# Patient Record
Sex: Male | Born: 1950 | Race: Black or African American | Hispanic: No
Health system: Southern US, Community
[De-identification: ages and names within clinical notes are randomized; demographics above are authoritative.]

---

## 2004-03-23 ENCOUNTER — Emergency Department: Payer: Self-pay | Admitting: Emergency Medicine

## 2004-04-01 ENCOUNTER — Other Ambulatory Visit: Payer: Self-pay

## 2005-08-12 ENCOUNTER — Other Ambulatory Visit: Payer: Self-pay

## 2005-08-12 ENCOUNTER — Emergency Department: Payer: Self-pay | Admitting: Emergency Medicine

## 2005-08-17 ENCOUNTER — Other Ambulatory Visit: Payer: Self-pay

## 2005-08-17 ENCOUNTER — Emergency Department: Payer: Self-pay | Admitting: Emergency Medicine

## 2005-08-23 ENCOUNTER — Emergency Department: Payer: Self-pay | Admitting: Emergency Medicine

## 2005-10-15 ENCOUNTER — Ambulatory Visit: Payer: Self-pay | Admitting: Internal Medicine

## 2006-11-22 ENCOUNTER — Inpatient Hospital Stay: Payer: Self-pay | Admitting: *Deleted

## 2007-06-19 ENCOUNTER — Ambulatory Visit: Payer: Self-pay

## 2012-02-28 ENCOUNTER — Inpatient Hospital Stay: Payer: Self-pay | Admitting: Internal Medicine

## 2012-02-28 LAB — CBC
HGB: 15 g/dL (ref 13.0–18.0)
MCH: 30 pg (ref 26.0–34.0)
MCV: 89 fL (ref 80–100)
Platelet: 233 10*3/uL (ref 150–440)
RBC: 5 10*6/uL (ref 4.40–5.90)
RDW: 12.3 % (ref 11.5–14.5)
WBC: 7.7 10*3/uL (ref 3.8–10.6)

## 2012-02-28 LAB — COMPREHENSIVE METABOLIC PANEL
Anion Gap: 8 (ref 7–16)
Bilirubin,Total: 0.7 mg/dL (ref 0.2–1.0)
Chloride: 102 mmol/L (ref 98–107)
Co2: 24 mmol/L (ref 21–32)
Creatinine: 1.02 mg/dL (ref 0.60–1.30)
EGFR (African American): >60
EGFR (Non-African Amer.): >60
Osmolality: 276 (ref 275–301)
Potassium: 4 mmol/L (ref 3.5–5.1)
Sodium: 134 mmol/L — ABNORMAL LOW (ref 136–145)
Total Protein: 8.7 g/dL — ABNORMAL HIGH (ref 6.4–8.2)

## 2012-02-28 LAB — URINALYSIS, COMPLETE
Bilirubin,UR: NEGATIVE
Glucose,UR: >=500 mg/dL (ref 0–75)
Leukocyte Esterase: NEGATIVE
Ph: 5 (ref 4.5–8.0)
Protein: 30
RBC,UR: 2 /HPF (ref 0–5)
Squamous Epithelial: NONE SEEN
WBC UR: 1 /HPF (ref 0–5)

## 2012-02-28 LAB — DRUG SCREEN, URINE
Barbiturates, Ur Screen: NEGATIVE (ref ?–200)
Cannabinoid 50 Ng, Ur ~~LOC~~: NEGATIVE (ref ?–50)
Cocaine Metabolite,Ur ~~LOC~~: NEGATIVE (ref ?–300)
MDMA (Ecstasy)Ur Screen: NEGATIVE (ref ?–500)
Methadone, Ur Screen: NEGATIVE (ref ?–300)
Opiate, Ur Screen: NEGATIVE (ref ?–300)
Phencyclidine (PCP) Ur S: NEGATIVE (ref ?–25)

## 2012-02-29 LAB — BASIC METABOLIC PANEL
Anion Gap: 6 — ABNORMAL LOW (ref 7–16)
Calcium, Total: 8.6 mg/dL (ref 8.5–10.1)
Chloride: 106 mmol/L (ref 98–107)
Creatinine: 0.99 mg/dL (ref 0.60–1.30)
EGFR (Non-African Amer.): >60
Glucose: 206 mg/dL — ABNORMAL HIGH (ref 65–99)
Osmolality: 281 (ref 275–301)
Sodium: 137 mmol/L (ref 136–145)

## 2012-02-29 LAB — LIPID PANEL
Cholesterol: 148 mg/dL (ref 0–200)
HDL Cholesterol: 50 mg/dL (ref 40–60)
Ldl Cholesterol, Calc: 86 mg/dL (ref 0–100)
VLDL Cholesterol, Calc: 12 mg/dL (ref 5–40)

## 2012-03-04 ENCOUNTER — Ambulatory Visit: Payer: Self-pay | Admitting: Neurology

## 2012-03-06 DIAGNOSIS — G459 Transient cerebral ischemic attack, unspecified: Secondary | ICD-10-CM

## 2012-04-01 ENCOUNTER — Inpatient Hospital Stay: Payer: Self-pay | Admitting: Student

## 2012-04-01 LAB — CBC
HCT: 40.3 % (ref 40.0–52.0)
HGB: 14 g/dL (ref 13.0–18.0)
MCH: 29.6 pg (ref 26.0–34.0)
MCV: 86 fL (ref 80–100)
Platelet: 243 10*3/uL (ref 150–440)
RBC: 4.72 10*6/uL (ref 4.40–5.90)
WBC: 5.3 10*3/uL (ref 3.8–10.6)

## 2012-04-01 LAB — COMPREHENSIVE METABOLIC PANEL
Albumin: 4.3 g/dL (ref 3.4–5.0)
Alkaline Phosphatase: 76 U/L (ref 50–136)
Anion Gap: 7 (ref 7–16)
BUN: 16 mg/dL (ref 7–18)
Calcium, Total: 9.6 mg/dL (ref 8.5–10.1)
Chloride: 110 mmol/L — ABNORMAL HIGH (ref 98–107)
EGFR (Non-African Amer.): >60
Glucose: 137 mg/dL — ABNORMAL HIGH (ref 65–99)
Potassium: 4.2 mmol/L (ref 3.5–5.1)
SGOT(AST): 70 U/L — ABNORMAL HIGH (ref 15–37)
SGPT (ALT): 141 U/L — ABNORMAL HIGH (ref 12–78)
Total Protein: 8.8 g/dL — ABNORMAL HIGH (ref 6.4–8.2)

## 2012-04-02 ENCOUNTER — Ambulatory Visit: Payer: Self-pay | Admitting: Neurology

## 2012-04-02 LAB — CBC WITH DIFFERENTIAL/PLATELET
Basophil #: 0 10*3/uL (ref 0.0–0.1)
Eosinophil #: 0.1 10*3/uL (ref 0.0–0.7)
Lymphocyte %: 36.7 %
MCHC: 33.9 g/dL (ref 32.0–36.0)
MCV: 86 fL (ref 80–100)
Monocyte %: 7.7 %
Neutrophil %: 53.8 %
Platelet: 215 10*3/uL (ref 150–440)
RDW: 11.9 % (ref 11.5–14.5)
WBC: 5.5 10*3/uL (ref 3.8–10.6)

## 2012-04-02 LAB — HEMOGLOBIN A1C: Hemoglobin A1C: 7.6 % — ABNORMAL HIGH (ref 4.2–6.3)

## 2012-04-02 LAB — BASIC METABOLIC PANEL
Anion Gap: 8 (ref 7–16)
Calcium, Total: 8.5 mg/dL (ref 8.5–10.1)
Chloride: 107 mmol/L (ref 98–107)
Co2: 21 mmol/L (ref 21–32)
Creatinine: 1.01 mg/dL (ref 0.60–1.30)
Osmolality: 278 (ref 275–301)

## 2012-04-02 LAB — OCCULT BLOOD X 1 CARD TO LAB, STOOL: Occult Blood, Feces: NEGATIVE

## 2012-04-03 LAB — CBC WITH DIFFERENTIAL/PLATELET
Basophil #: 0 10*3/uL (ref 0.0–0.1)
Basophil %: 0.3 %
Eosinophil #: 0.1 10*3/uL (ref 0.0–0.7)
HCT: 32.1 % — ABNORMAL LOW (ref 40.0–52.0)
Lymphocyte #: 2.2 10*3/uL (ref 1.0–3.6)
Lymphocyte %: 49.5 %
MCH: 29.1 pg (ref 26.0–34.0)
MCV: 84 fL (ref 80–100)
Monocyte #: 0.5 x10 3/mm (ref 0.2–1.0)
Monocyte %: 10.1 %
Neutrophil #: 1.7 10*3/uL (ref 1.4–6.5)
RBC: 3.84 10*6/uL — ABNORMAL LOW (ref 4.40–5.90)
RDW: 12.6 % (ref 11.5–14.5)
WBC: 4.5 10*3/uL (ref 3.8–10.6)
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
## 2014-07-30 NOTE — H&P (Signed)
PATIENT NAME:  Jordan [name], Jordan [name] MR#:  [id number] DATE OF BIRTH:  27-Mar-1951  DATE OF ADMISSION:  02/28/2012  ER REFERRING PHYSICIAN: Dr. Mayford KnifeWilliams.   PRIMARY CARE PHYSICIAN: [facility].   CHIEF COMPLAINT: Dizziness, headache, nausea and vomiting.   HISTORY OF PRESENT ILLNESS: The patient is a 64 year old male with past medical history of diabetes, hypertension, lower GI bleed due to diverticulosis, who reports that for the past one week he has been having sudden onset of dizziness, dizzy spells which last for a few minutes, associated with loss of balance. When he has these spells, the left side of his head and face feels funny, and his left ear feels funny. He denies any ear pain or tinnitus. The dizzy spells happen even if he is lying down or sitting up. The patient fells like he is drunk. For the last 2 to 3 days every time he has been trying to take his medications, especially metformin, he has been feeling nauseous and has thrown up. He denies any recent upper respiratory infection, denies having any similar symptoms in the past. The patient reports that about two weeks ago he came in contact with an organization that was doing free blood work for testing for sexually transmitted diseases, HIV and hepatitis; and he gave blood for that when he was found to have hepatitis C. The patient reports that he has received blood transfusion in the past when he had a lower GI bleed.   ALLERGIES: Sulfa medications.   MEDICATIONS:  1. Glipizide 2.5 mg daily.  2. Lisinopril 20 mg b.i.[name].  3. Metformin 500 mg b.i.[name].   PAST MEDICAL HISTORY:  1. Diabetes. 2. Hypertension. 3. Recently diagnosed hepatitis C.  4. History of diverticulosis with lower gastrointestinal bleed.  5. History of diverticulitis.   PAST SURGICAL HISTORY: Cholecystectomy.   SOCIAL HISTORY: Denies any history of smoking, alcohol or drug abuse. He is currently homeless and is living in a church shelter.   FAMILY HISTORY: Diabetes  and hypertension runs in his family. Grandfather died of myocardial infarction. Mother and two sisters had breast cancer.    REVIEW OF SYSTEMS: CONSTITUTIONAL: Denies any fever, fatigue, weakness. EYES: Denies any blurred or double vision. ENT: Denies any tinnitus, ear pain. RESPIRATORY: Denies any cough or wheezing. CARDIOVASCULAR: Denies chest pain or palpitations. GI: Reports nausea and vomiting. Denies any abdominal pain, diarrhea, constipation. GENITOURINARY: Denies any dysuria, hematuria. Denies any polyuria, nocturia. HEMATOLOGIC/LYMPHATIC: Denies any anemia or easy bruisability. INTEGUMENTARY: Denies any acne or rash. MUSCULOSKELETAL: Denies any swelling, gout. NEUROLOGICAL:   Reports headache and ataxia. PSYCHIATRIC: Denies any anxiety or depression.   PHYSICAL EXAMINATION:  VITAL SIGNS: Temperature 97.7, heart rate 88, respiratory rate 18, blood pressure 174/88, pulse oximetry 96% on room air.   GENERAL: The patient is a 64 year old African American male lying comfortably in bed, not in acute distress.   HEENT: Head: Atraumatic, normocephalic. Eyes: No pallor, icterus, or cyanosis. Pupils are equal, round, and reactive to light and accommodation.  Extraocular movements intact. ENT: Wet mucous membranes. No oropharyngeal erythema or thrush.   NECK: Supple. No masses. No JVD. No thyromegaly or lymphadenopathy.   CHEST WALL: No tenderness to palpation. Not using accessory muscles of respiration. No intercostal muscle retractions.   LUNGS: Bilaterally clear to auscultation. No wheezing, rales, or rhonchi.   CARDIOVASCULAR: S1, S2 regular. No murmur, rubs, or gallops.   ABDOMEN: Soft, nontender, nondistended. No guarding, no rigidity, no organomegaly. Normal bowel sounds.   SKIN: No rashes or  lesions.   PERIPHERIES: No pedal edema, 1+ pedal pulses.   MUSCULOSKELETAL: No cyanosis or clubbing.   NEUROLOGICAL: The patient is awake, alert, oriented x3. Cranial nerves are grossly intact.  Motor strength is five out of five. When the patient tries to ambulate, he is having broad-based gait. Positive Romberg's. No nystagmus.   PSYCHIATRIC: Normal mood and affect.   LABORATORY, DIAGNOSTIC AND RADIOLOGICAL DATA: CT of the head shows no acute abnormality. Urinalysis showed no evidence of infection. CBC normal. Glucose 252, BUN 10, creatinine 1.02, sodium 134, potassium 4, chloride 102, CO2 24, calcium 9.2, bilirubin 0.7, AST 76, ALT 84, AST 14, total protein 8.7.   ASSESSMENT AND PLAN: A 64 year old male with history of diabetes, hypertension, presents with dizziness, nausea, vomiting.   1. Dizziness/ataxia: Differential diagnosis includes vertigo versus CVA. We will admit the patient to the hospital, get MRI and MRA, start the patient on a low-dose aspirin. We will give a trial of meclizine and diazepam at bedtime. We will obtain a PT consult and a Neurology consult.  2. Accelerated hypertension: This could be due to acute stress. We will continue the patient's current medication at the current dose for the time being to prevent hypertension in case he is found to have a CVA on his MRI.  3. Diabetes: Appears to be uncontrolled. The patient reports recently his sugars were running in the 500s. We will increase the dose of his glipizide, place him on insulin sliding-scale,  ADA diet, and continue his metformin.  4. Elevated AST, ALT: The patient reports that he has been recently diagnosed with hepatitis C. He has received blood transfusion in the past.   I reviewed old medical records, discussed with the ED physician, discussed with the patient the plan of care and management.   TIME SPENT: 75 minutes.   ____________________________ Darrick Meigs, MD sp:cbb [name]: 02/28/2012 17:41:22 ET T: 02/28/2012 18:47:02 ET JOB#: [id number]  cc: Darrick Meigs, MD, <Dictator> [facility] Darrick Meigs MD ELECTRONICALLY SIGNED 02/28/2012 22:05

## 2014-07-30 NOTE — Consult Note (Signed)
PATIENT NAME:  Jordan [name], Jordan [name] MR#:  [id number] DATE OF BIRTH:  1950-08-15  DATE OF CONSULTATION:  04/01/2012  REFERRING PHYSICIAN:  Dr. Jacques NavyAhmadzia. CONSULTING PHYSICIAN:  Lurline DelShaukat Darria Corvera, MD  REASON FOR CONSULTATION: Hematochezia.   HISTORY OF PRESENT ILLNESS: The patient is a 64 year old male with history of hypertension, diabetes, hepatitis C. The patient was admitted with 2 to 3 episodes of bright red blood per rectum that started at 2 o'clock this morning. According to the patient, he was in his normal state of health until around 2 o'clock this morning, when he went to bathroom and passed some regular stool with some blood around it. He had 2 more bowel movements since then which were mostly bloody stool with bright to dark red blood. He denies any melena. Denies any abdominal pain. No nausea, vomiting, hematemesis, fever or chills. Hemoglobin is 14 and he is hemodynamically stable.   PAST MEDICAL HISTORY: Significant for history of similar bleeding in 2008. The patient had a colonoscopy in 2008 which showed sigmoid diverticulosis. The patient required embolization in 2008 to stop the bleeding. Past medical history is also significant for hepatitis C according to his medical records. History of recent cerebellar stroke and diabetes.   HOME MEDICATIONS:  Include aspirin 325 mg a day, lisinopril 20 mg a day, metformin 500 mg b.i.[name]., simvastatin 40 mg at bedtime.   ALLERGIES: SULFA.   PAST SURGICAL HISTORY:  Cholecystectomy.   FAMILY HISTORY: Unremarkable.   SOCIAL HISTORY: Does not smoke or drink. Review of systems is grossly negative except for what is mentioned in the history of present illness.   PHYSICAL EXAMINATION: GENERAL: Well-built male who does not appear to be in any acute distress. Fully awake, alert and oriented.  VITAL SIGNS: Heart rate is in the 60s and 70s. Blood pressure 170/88.  HEENT:  Clinically he does not appear to be jaundiced or anemic.  NECK: Neck veins are flat.   LUNGS: Clear to auscultation bilaterally with fair entry and no added sounds.  CARDIOVASCULAR: Regular rate and rhythm.  ABDOMEN: Abdominal examination is benign. Bowel sounds are positive. Abdomen is soft and nontender. No hepatosplenomegaly was noted.  NEUROLOGIC: Examination appears to be grossly nonfocal.   LABS AND STUDIES: Hemoglobin is 14, white cell count 5.3, platelet count 243. Electrolytes, BUN and creatinine fairly normal. ALT 141, AST 70. PT and INR are within normal limits.   ASSESSMENT AND PLAN: The patient is with probable left-sided diverticular bleed. The patient has a history of diverticular-type bleeding in 2002 and 2008. In 2008, he required embolization to control the bleeding. He is clinically stable, his hemoglobin is normal, and the bleeding seems to be subsiding as the last bowel movement had much less amount of blood than previously. I would recommend continuing him on a clear liquid diet, follow hemoglobin and hematocrit, and transfuse as needed. Hold aspirin for now although once the bleeding is under control the patient can be started on aspirin due to his recent history of cerebrovascular accident. If bleeding continues, will obtain bleeding scan and vascular surgery consultation. The patient will probably require elective outpatient sigmoid resection to prevent further episodes of hematochezia. We will follow and further recommendations to follow.      ____________________________ Lurline DelShaukat Chipper Koudelka, MD si:cs [name]: 04/01/2012 15:18:00 ET T: 04/02/2012 19:03:42 ET JOB#: [id number]  cc: Lurline DelShaukat Lacrecia Delval, MD, <Dictator> Lurline DelSHAUKAT Onyinyechi Huante MD ELECTRONICALLY SIGNED 04/03/2012 18:12

## 2014-07-30 NOTE — Consult Note (Signed)
Chief Complaint:   Subjective/Chief Complaint Has one small bowel movement this morning which was described as dark with minimal blood around it. Occult blood testing performed on the specimen reports no blood. No BM last night. Vitals stable.   VITAL SIGNS/ANCILLARY NOTES: **Vital Signs.:   22-Dec-13 04:33   Vital Signs Type Routine   Temperature Temperature (F) 98.6   Celsius 37   Temperature Source oral   Pulse Pulse 91   Respirations Respirations 20   Systolic BP Systolic BP 149   Diastolic BP (mmHg) Diastolic BP (mmHg) 75   Mean BP 99   Pulse Ox % Pulse Ox % 99   Pulse Ox Activity Level  At rest   Oxygen Delivery Room Air/ 21 %   Lab Results: Routine Sero:  22-Dec-13 09:52    Occult Blood, Feces NEGATIVE (Result(s) reported on 02 Apr 2012 at 10:56AM.)   Assessment/Plan:  Assessment/Plan:   Assessment Lower GI bleed, most likely diverticular. Clinically bleeding seems to have stopped. H and H did drop yesterday and CBC from today is pending.    Plan Continue clear liquid diet. Will follow H and H from this morning. Will follow.   Electronic Signatures: Lurline DelIftikhar, Savio Albrecht (MD)  (Signed 22-Dec-13 11:05)  Authored: Chief Complaint, VITAL SIGNS/ANCILLARY NOTES, Lab Results, Assessment/Plan   Last Updated: 22-Dec-13 11:05 by Lurline DelIftikhar, Sederick Jacobsen (MD)

## 2014-07-30 NOTE — Consult Note (Signed)
Brief Consult Note: Diagnosis: Probable diverticular bleed.   Patient was seen by consultant.   Consult note dictated.   Comments: Hematochezia most likely secondary to left sided diverticulosis seen on colonoscopy 2008. patient has h/o diverticular type bleeding in 2000 and 2008 requiring embolization in 2008. Overall he seems to be stable and bleeding seems to be subsiding.  Recommendation: Follow H and H. Clear liquid diet. If signs of signisicant active bleeding please obtain bleeding scan and Vascular surgery consult. Will follow.  Electronic Signatures: Lurline DelIftikhar, Hideko Esselman (MD)  (Signed 21-Dec-13 15:12)  Authored: Brief Consult Note   Last Updated: 21-Dec-13 15:12 by Lurline DelIftikhar, Namiah Dunnavant (MD)

## 2014-07-30 NOTE — Consult Note (Signed)
Brief Consult Note: Diagnosis: Stroke:.   Comments: 1) Acute onset of dizziness, imbalance, inco-ordination, transient diplopia MRI: bil cerebellar infract, (bil PICA vs proximal basillary infract), + old lacunar infracts (left caudate and right corona radiata) - etiology: embolic based on nature of brain involvement - pls do stroke w/up - CD, ECHO, Lipid panel is optimal (LDL 86, HDL 50 - still can cosider low dose statin for it's non lipid lowering effects on secondary stroke prevention) - ASA 325 (he was on ASA 81 but lately was not compliant) - He was not tPA candidate due to his arrival after many weeks of symptom onset. Treatment:  - avoid extreme  hypo/hyper tension and gentle BP reducation in peristroke period to maintain perfusion in ischemic penumbra. (160/90 with MAP 110. Consider using short acting meds with short half life, avoid NTG and nitropruside due to chance of increasing intracranial pressure) - treat fever early (hyperthermia impairs stroke recovery),  - aggressive PT/OT/ST, early bedside swallow evaluation.  - DVT prophylaxis  - Consider flu vaccine and multivitamines.  - pt is non smoker - Pt to keep an eye open for post stroke depression, educated on fall prevention. - will follow as out pt in 2-3 wks.  Electronic Signatures: Jolene ProvostShah, Skyllar Notarianni Kalpeshkumar (MD)  (Signed [phone]-Nov-13 08:34)  Authored: Brief Consult Note   Last Updated: 20-Nov-13 08:34 by Jolene ProvostShah, Rhoderick Farrel Kalpeshkumar (MD)

## 2014-07-30 NOTE — Discharge Summary (Signed)
PATIENT NAME:  Jordan [name], Jordan [name] MR#:  [id number] DATE OF BIRTH:  December 07, 1950  DATE OF ADMISSION:  02/28/2012 DATE OF DISCHARGE:  03/06/2012  PRIMARY CARE PHYSICIAN: Dr. Beverely RisenFozia Khan    CONSULTATION: Neurology, Dr. Sherryll BurgerShah.   DISCHARGE DIAGNOSES:  1. Subacute cerebrovascular accident.  2. Hypertension.  3. Diabetes.  4. Hepatitis C.   CODE STATUS: FULL CODE.   CONDITION: Stable.   PROCEDURE: TEE which is normal.   HOME MEDICATIONS:  1. Glipizide  5 mg p.o. 0.5 tablets once daily.  2. Metformin 500 mg p.o. b.i.[name].   3. Lisinopril 20 mg p.o. b.i.[name].  4. Aspirin 325 mg p.o. daily.  5. Zocor 40 mg p.o. at bedtime.   DIET: Low sodium, low fat, low cholesterol, ADA diet.   ACTIVITY: As tolerated.   FOLLOW-UP CARE: Follow up PCP within 1 to 2 weeks. Also patient needs physical therapy as outpatient   REASON FOR ADMISSION: Dizziness, headache, nausea and vomiting.   HOSPITAL COURSE: Patient is a 64 year old African American male with history of hypertension, diabetes, lower gastrointestinal bleeding due to diverticulosis presented to ED with:  1. Sudden onset of dizziness associated with loss of balance. For detailed history and physical examination, please refer to the admission note dictated by Dr. Dava NajjarPanwar. Initially patient was admitted for dizziness and ataxia. Differential diagnosis includes vertigo versus CVA, so patient got a CAT scan of head which showed lucencies in the cerebellar hemisphere. Patient was started with aspirin and statin for possible CVA and was given meclizine and diltiazem at bedtime. Also neurology consult was requested. According to neurology consult suggestion, patient got repeated CAT scan of head yesterday which showed subacute CVA. After admission patient has been treated with aspirin and statin. Patient a TEE today per neurology recommendation. TEE showed normal test with ejection fraction 55%. In addition patient received physical therapy during hospitalization.   2. Accelerated hypertension. Patient has been treated with lisinopril to keep blood pressure around 150 to 160.  3. Diabetes. Patient has been treated with sliding scale.  4. Patient's symptoms have much improved but still has mild dizziness. He is clinically stable, will be discharged to home today. Discussed the patient's discharge plan with the patient and discussed with the case manager and nurse.   TIME SPENT: About 43 minutes.   ____________________________ Shaune PollackQing Layne Lebon, MD qc:cms [name]: 03/06/2012 17:19:54 ET T: 03/07/2012 13:41:10 ET JOB#: [id number]  cc: Shaune PollackQing Anwen Cannedy, MD, <Dictator> Lyndon CodeFozia M. Khan, MD Shaune PollackQING Liat Mayol MD ELECTRONICALLY SIGNED 03/07/2012 17:16

## 2014-07-30 NOTE — Discharge Summary (Signed)
PATIENT NAME:  Jordan [name], Jordan [name] MR#:  [id number] DATE OF BIRTH:  1950/12/18  CONSULTANTS: 1.  Lurline DelShaukat Iftikhar, MD from gastroenterology. 2.  Mont Duttonhrista Swisher, MD from neurology.  CHIEF COMPLAINT:  Bright red blood per rectum, bloody stools.  PRIMARY CARE PHYSICIAN: [facility].   DISCHARGE DIAGNOSES: 1.  Acute lower gastrointestinal bleed, likely diverticular in nature.  2.  Acute anemia, likely from above.  3.  Hypertension.  4.  Diabetes.  5.  History of recent stroke.  6.  History of hepatitis C.  7. History of lower gastrointestinal bleed in the past requiring embolization in 2008 of a diverticula.  8.  History of diverticulosis.  DISCHARGE MEDICATIONS:  Aspirin 81 mg daily, lisinopril 20 mg 2 times a day, metformin 500 mg 2 times a day, simvastatin 40 mg once a day and amlodipine 5 mg 1 tab daily.   DISPOSITION:  Home.   DIET:  ADA, low salt, low salt, low cholesterol, low sodium diet.   FOLLOWUP:  Please follow with your primary care physician and Dr. Niel HummerIftikhar about 1 to 2 weeks as well as with neurology within 1 to 2 weeks.   DISPOSITION: Home.   HISTORY OF PRESENT ILLNESS:  For all the details of the H and P, please see the dictation on 04/01/2012 by Dr. Jacques NavyAhmadzia.  Briefly, this is a 64 year old African American male with history of diabetes, hyperlipidemia, hep. C, recent stroke, history of GI bleed who presents with bright red blood per rectum. Initial hemoglobin was 14. The bleeding started about 2:30 a.m. the day before admission and the patient had several episodes and therefore, was admitted to the hospitalist service.   SIGNIFICANT LABORATORIES AND IMAGING:  Initial BUN 16 creatinine 0.99, chloride 110. LFTs: ALT 141, AST 70, total protein 8.8.  Initial hemoglobin 14, lowest hemoglobin was 11.2.  Initial WBC 5.3. INR initially was 0.9 and stool guaiac was negative December 22.   HOSPITAL COURSE: The patient was admitted to the hospitalist service. He had a noneventful  hospital stay here. He did not have any significant bleeding while in the hospital, but required a transfusion. GI was consulted and the patient was seen by Dr. Niel HummerIftikhar. The patient likely had a diverticular bleed and his hemoglobin is stable. He has had no further bleed and a stool guaiac has been sent which was negative. He is hemodynamically stable. He is to follow with GI in about 1 to 2 weeks. He was also seen by neurology. The question was whether to discharge him on the aspirin or not, given his GI bleed. 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 At this point, given his stroke, and given the significant blockages which can predispose him for further strokes, he will be discharged with a low dose of aspirin with close follow-up with his PCP and outpatient recommendation to follow with neurology. He does have some vertigo and tinnitus and some ataxia from his previous stroke. His diet has been advanced and he has no abdominal pain. He will be discharged with outpatient follow-up.   Total time spent: 35 minutes.     ____________________________ Krystal EatonShayiq Trisa Cranor, MD sa:ct [name]: 04/04/2012 11:19:00 ET T: 04/04/2012 11:51:19 ET JOB#: [id number]  cc: [facility] Krystal EatonShayiq Duana Benedict, MD, <Dictator>   Krystal EatonSHAYIQ Crespin Forstrom MD ELECTRONICALLY SIGNED 04/11/2012 14:14

## 2014-07-30 NOTE — H&P (Signed)
PATIENT NAME:  Jordan [name], Jordan [name] MR#:  [id number] DATE OF BIRTH:  08-11-1950  DATE OF ADMISSION:  04/01/2012  REFERRING PHYSICIAN: Dr. Carollee MassedKaminski.   PRIMARY CARE PHYSICIAN: [facility].   CHIEF COMPLAINT: Bloody stools.   HISTORY OF PRESENT ILLNESS: The patient is a 64 year old African American male with a history of diabetes, hypertension, hepatitis C and a recent admission for stroke who is here for a lower GI bleed. The patient states that he has a history of diverticulosis in the past. On chart the patient appeared to have a lower GI bleed, which was deemed to be diverticular in nature in 2008 during which the patient had embolization. The patient has had no further GI bleed since until last night where about 2:30 a.m., the patient had blood in the stool. The patient had another bout of bloody stools. This was described as blood in the stool, not necessarily around it, it was a bright red to darkish in nature. The patient stated that he had another episode here. His hemoglobin here is in the 14 range. He has no abdominal pain. He has not taken his aspirin today. Hospitalist service was contacted for further evaluation and management.   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  OUTPATIENT MEDICATIONS: Aspirin 325 mg 1 tab once a day, lisinopril 20 mg 2 times a day, metformin 500 mg 2 times a day, simvastatin 40 mg once a day at bedtime.   ALLERGIES: SULFA.   PAST SURGICAL HISTORY: Cholecystectomy.   SOCIAL HISTORY: No alcohol, drug, or smoking history. He lives with her sister.   FAMILY HISTORY: Positive for diabetes and hypertension. Grandfather died from an MI. Mom and two sisters had breast cancer.  REVIEW OF SYSTEMS:  CONSTITUTIONAL: No fever, headache, weight changes or  weakness.  EYES: No blurry vision or double vision.  ENT: No ear pain but the patient started to have tinnitus in early December on the right side.  RESPIRATORY: No cough, wheezing, shortness of breath or hemoptysis.  CARDIOVASCULAR: No chest pain, no orthopnea or edema. Positive high blood pressure.  GASTROINTESTINAL: No nausea, vomiting, diarrhea, abdominal pain. Positive for bloody stools as above. History of diverticulosis and diverticulitis in the past.  GENITOURINARY: No dysuria or hematuria.  HEME/LYMPH: No anemia or easy bruising.  SKIN: No new rashes.  MUSCULOSKELETAL: Denies any arthritis.  NEUROLOGICAL: History of recent stroke, but no longer vertigos.  PSYCHIATRIC: No anxiety or insomnia.   PHYSICAL EXAMINATION:  VITAL SIGNS: Temperature on arrival 98.1, pulse rate 84, respiratory rate 18, blood pressure 184/97, currently 161/89, O2 sat 100% on room air.  GENERAL: The patient is a well-developed, African American male laying in bed in no obvious distress.  HEENT: Normocephalic, atraumatic. Pupils are equal and reactive. Anicteric sclerae. Poor dentition.  NECK: Supple. No thyroid tenderness, cervical lymphadenopathy.  CARDIOVASCULAR: S1, S2, regular rate and rhythm. No murmurs, rubs, or gallops.  LUNGS: Clear to auscultation without wheezing or rhonchi.  ABDOMEN: Soft, nontender, nondistended. Positive hyperactive bowel sounds in all quadrants.  EXTREMITIES: No significant lower extremity edema.  NEUROLOGICAL: Cranial nerves II through XII are grossly intact. Strength is 5/5 in all extremities. Sensation intact to light touch.  SKIN: There are some chronic skin changes in the upper right torso otherwise no redness and no evidence of cellulitis.  PSYCHIATRIC:  Awake, alert, oriented x 3. Pleasant, cooperative, conversant.   LABORATORY, DIAGNOSTIC, AND RADIOLOGICAL DATA: Glucose 137, BUN 16, creatinine 0.99, sodium 139, chloride 110. LFTs: Total protein is 8.8, AST 78, ALT 141. WBC  is 5.3, hemoglobin 14, hematocrit 40.3, platelets 243. INR is 0.9.   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 We would admit the patient to the hospital at this point. His blood pressure is stable at this point, but he is a high risk patient for further bleeds given his history and the need for embolization in the past. We would cycle the hemoglobin, get a GI consult and hold his aspirin for now. We would put him on clears. In regards to the CT, this is a complicated situation given the recent stroke and atherosclerotic disease in the Circle of Willis and some chronic occlusion which makes the patient a high risk patient for recurrence of strokes. We would obtain a Neurology consult in regards to antiplatelet versus anticoagulation for this patient. He did have a TEE which was negative on the last admission and has a normal ejection fraction as well. The patient also did not have any significant carotid disease. His vertigo has resolved, but he does have some tinnitus which could be from the aspirin as well. We would continue his blood pressure medications, start a sliding scale regimen and check a hemoglobin A1c. We would start the patient on some gentle IV fluids as well. Start him on deep vein thrombosis prophylaxis with TEDS and SCDs and a proton pump inhibitor IV b.i.[name]. as well at this point.   CODE STATUS:  FULL CODE.  TOTAL TIME SPENT: 60 minutes   ____________________________ Krystal Eaton, MD sa:jm [name]: 04/01/2012 12:23:38  ET T: 04/01/2012 13:32:53 ET JOB#: [id number]  cc: Krystal Eaton, MD, <Dictator> Krystal Eaton MD ELECTRONICALLY SIGNED 04/11/2012 14:13

## 2014-07-30 NOTE — Consult Note (Signed)
Chief Complaint:   Subjective/Chief Complaint No complaints. One normal BM today without blood.   VITAL SIGNS/ANCILLARY NOTES: **Vital Signs.:   23-Dec-13 04:35   Vital Signs Type Routine   Temperature Temperature (F) 98.2   Celsius 36.7   Temperature Source Oral   Pulse Pulse 81   Respirations Respirations 20   Systolic BP Systolic BP 150   Diastolic BP (mmHg) Diastolic BP (mmHg) 78   Mean BP 102   Pulse Ox % Pulse Ox % 97   Pulse Ox Activity Level  At rest   Oxygen Delivery Room Air/ 21 %   Lab Results: Routine Hem:  23-Dec-13 04:41    WBC (CBC) 4.5   RBC (CBC)  3.84   Hemoglobin (CBC)  11.2   Hematocrit (CBC)  32.1   Platelet Count (CBC) 203   MCV 84   MCH 29.1   MCHC 34.8   RDW 12.6   Neutrophil % 38.2   Lymphocyte % 49.5   Monocyte % 10.1   Eosinophil % 1.9   Basophil % 0.3   Neutrophil # 1.7   Lymphocyte # 2.2   Monocyte # 0.5   Eosinophil # 0.1   Basophil # 0.0 (Result(s) reported on 03 Apr 2012 at 05:39AM.)   Assessment/Plan:  Assessment/Plan:   Assessment Probable diverticular bleed, resolved. No evidence of active bleeding. H and H stable at 11.2.    Plan Advance diet. Probable DC tomorrow if no further bleeding. Follow up with me in few weeks (orders written). May use baby ASA if strongly indicated. Will sign off. Please call me if needed. Thanks.   Electronic Signatures: Lurline DelIftikhar, Tallis Soledad (MD)  (Signed 23-Dec-13 12:26)  Authored: Chief Complaint, VITAL SIGNS/ANCILLARY NOTES, Lab Results, Assessment/Plan   Last Updated: 23-Dec-13 12:26 by Lurline DelIftikhar, Shadara Lopez (MD)

## 2014-07-30 NOTE — Consult Note (Signed)
PATIENT NAME:  Jordan [name], [name] MR#:  [name] DATE OF BIRTH:  1950-11-16  DATE OF CONSULTATION:  03/04/2012  REFERRING PHYSICIAN:   CONSULTING PHYSICIAN:  Jordan BrownsYuriy Verdia Bolt, MD  HISTORY OF PRESENT ILLNESS: This is a 64 year old male who presented on 03/02/2012 with dizziness and was found to have bilateral cerebellar infarcts as well as left corona and right corona radiata infarct and these were seen on the MRI of the brain. Since the onset of the symptoms currently the patient states he is doing much better. He was able to stand and walk with minimal assistance to the bathroom. His vertigo is worse with sudden movements from one side to the other but overall states he is feeling much better.   NEUROLOGICAL EXAMINATION: The patient is awake, alert, and oriented to place, time, location, and person. Recent and remote memory are intact. Attention span and comprehension are intact. Naming, repetition, comprehension, and expressive speech are all intact without any abnormalities. On cranial nerve examination, no visual field deficits are noted. Pupils are 4 mm to 3 mm, reactive. Facial sensation intact. No signs of facial droop. Hearing is intact. Palate elevates symmetrically. Shoulder shrug present. Tongue is midline. Reflexes are 2+. Sensation is intact to light touch and temperature. Coordination appears to be intact.   ASSESSMENT: A 64 year old male with dizziness found to have bilateral cerebellar infarcts. His symptoms are improving.   PLAN: PT, OT, 2-D echocardiogram, and finish up the rest of neurological work-up such as lipid and Hemoglobin A1c.  Thank you, it was a pleasure seeing this patient.  ____________________________ Jordan BrownsYuriy Chaise Passarella, MD yz:slb D: 03/04/2012 13:16:22 ET     T: 03/04/2012 14:34:51 ET        JOB#: [id number] cc: Jordan BrownsYuriy Chevy Sweigert, MD, <Dictator> Jordan BrownsYURIY Kolbie Clarkston MD ELECTRONICALLY SIGNED 03/28/2012 19:14

## 2014-08-02 NOTE — Consult Note (Signed)
PATIENT NAME:  Jordan [name], Jordan [name] MR#:  [id number] DATE OF BIRTH:  03/12/1951  DATE OF CONSULTATION:  04/02/2012  REFERRING PHYSICIAN:  Krystal Eaton, MD CONSULTING PHYSICIAN:  Delania Ferg B. Almas Rake, MD  REASON FOR CONSULTATION:  Evaluate for post-stroke medication regimen.  HISTORY OF PRESENT ILLNESS:  The patient is a 64 year old male with a past medical history of left cerebellar stroke in November 2013, diabetes, hypertension and diverticulosis, who was admitted on 12/21 for GI bleed secondary to diverticulosis. Aspirin is currently held. No intervention has been performed. The GI bleed seems to be self resolving. His hematocrit today is stable at 35.9. In terms of his stroke, he has residual ataxia and tinnitus from his stroke approximately 1 month ago. At the time of the stroke he was not taking any antiplatelet regimen. He was then discharged on aspirin 325 mg daily as well as simvastatin 40 mg daily.  His stroke work-up included an echocardiogram that was unremarkable, carotid Dopplers that showed no hemodynamically significant stenosis and MR angiogram that showed diffuse atherosclerosis. He has not had any recurrent stroke symptoms since that time. He states he is compliant with the medication.   PAST MEDICAL HISTORY: Left cerebellar stroke in November 2013, diabetes, hypertension, hepatitis C, diverticulosis.   OUTPATIENT MEDICATIONS:  Aspirin 325 mg daily, lisinopril, metformin, simvastatin 40 mg daily.   ALLERGIES: Sulfa.   PAST SURGICAL HISTORY:  Cholecystectomy.   SOCIAL HISTORY: Denies smoking, alcohol or illicits.  He lives with his sister.   FAMILY HISTORY: Positive for diabetes and hypertension. Grandfather died of an MI, mom and 2 sisters have breast cancer.  PHYSICAL EXAMINATION:  VITAL SIGNS:  Temperature 98.6, pulse 71, respirations 20, blood pressure 149/75, pulse ox 99% on room air.  GENERAL:  Seated on the side of the bed.  No apparent distress.  CARDIOVASCULAR:  Regular  rate and rhythm.  RESPIRATORY:  Clear anteriorly.  ABDOMEN:  Soft.  SKIN:  No rashes or lesions.  MENTAL STATUS:  Alert and oriented x 4.  Naming and repetition intact, speech is fluent. No dysarthria.  CRANIAL NERVES:  Pupils equal, round and reactive to light. Full visual fields. Extraocular movements intact with mild nystagmus with gaze to the left, no facial sensory deficits. Facial expression symmetric.  Shoulder shrug 5 out of 5 bilaterally, tongue midline.    MOTOR:  No pronator drift, strength 5 out of 5 throughout.  COORDINATION:  No ataxia or dysmetria, bilateral finger-nose or heel-to-shin. SENSATION:  No deficits to light touch or temperature throughout.  DEEP TENDON REFLEXES:  2+ out of 4 throughout with absent ankle jerks bilaterally. Toes are downgoing.  GAIT:  Mildly ataxic with a wide base.   LABORATORIES:  CBC and BMP are unremarkable.   ASSESSMENT AND PLAN:  This patient is a 64 year old male with a recent left cerebellar stroke in November 2013, diabetes, and hypertension, who is admitted with a gastrointestinal bleeding. Neurology is being consulted to evaluate for him for post-stroke medication regimen of recommendations of antiplatelets versus anticoagulation.   RECOMMENDATIONS:  1.  There is no indication for anticoagulation at this time (he does not have DVTs, A. fib or CHF).  2.  Agree with continuing aspirin.  However, given that he is now having recurrent GI bleed, recommendations are to decrease aspirin to 81 mg daily.  Aspirin is currently held. I would defer to GI or the hospitalist service when they feel that it is safe to reinitiate this from a GI standpoint. 3.  Continue simvastatin  40 mg daily.  4.  Goal blood pressure less than 140/90.     ____________________________ Damarien Nyman B. Dameon Soltis, MD cbs:ct D: 04/02/2012 12:02:24 ET T: 04/03/2012 09:53:15 ET JOB#: [id number]  cc: Rawley Harju B. Alger SimonsSwisher, MD, <Dictator> ahm  Trellis PaganiniHRISTA B Amyriah Buras MD ELECTRONICALLY  SIGNED 04/17/2012 13:36

## 2014-09-04 ENCOUNTER — Other Ambulatory Visit: Payer: Self-pay

## 2014-10-13 IMAGING — US US CAROTID DUPLEX BILAT
1 series · 14 of 24 positions shown · non-contrast
Comparison: none

REASON FOR EXAM: stroke
COMMENTS:

[Series 1: us carotid duplex bilat · 0.08mm/px · 14 of 63 slices shown]
[im 1/63]
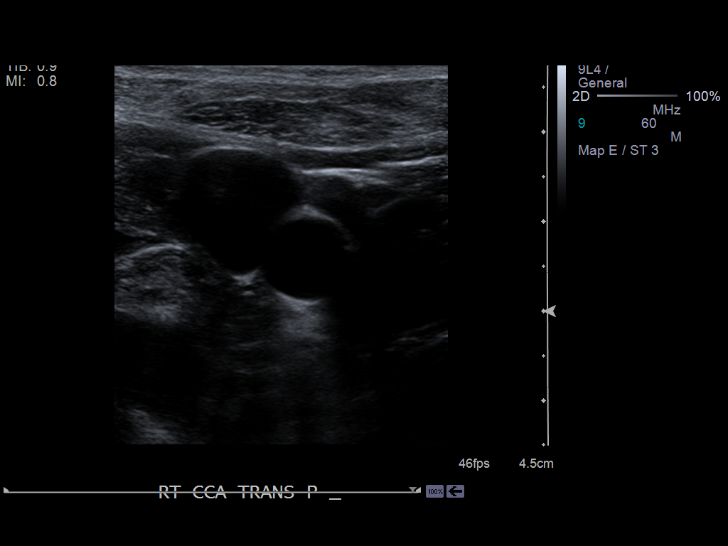
[im 6/63]
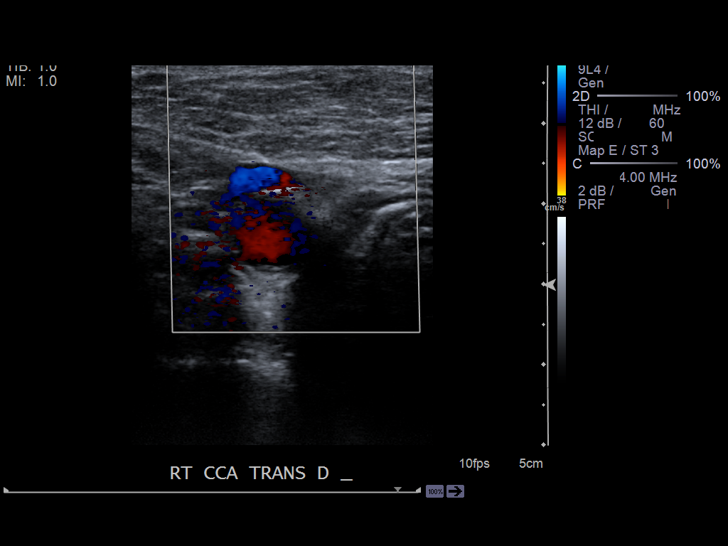
[im 11/63]
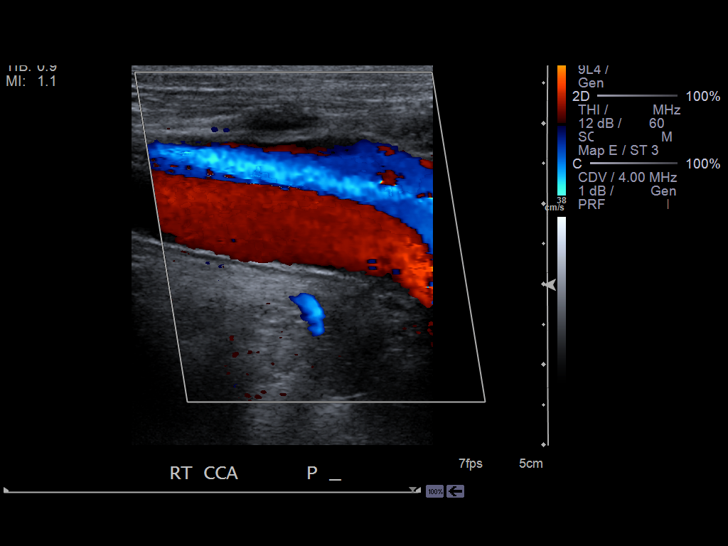
[im 17/63]
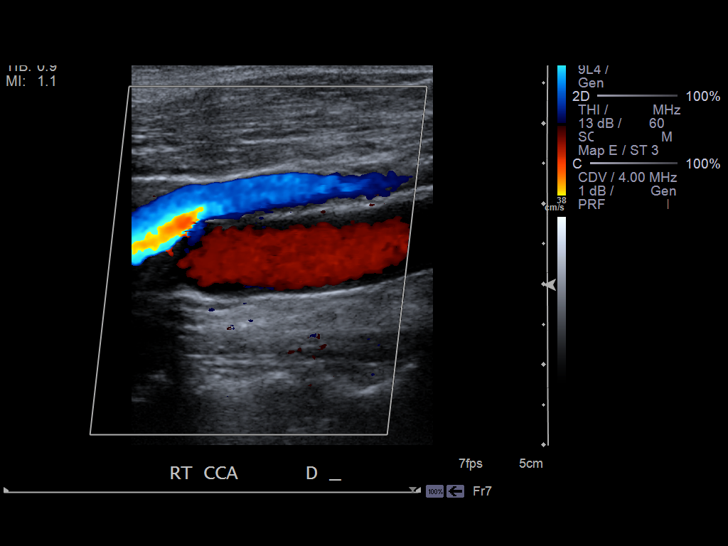
[im 19/63]
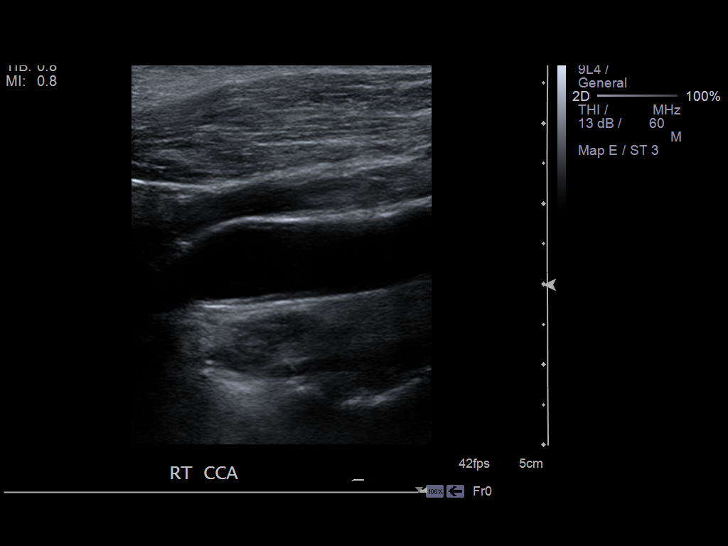
[im 25/63]
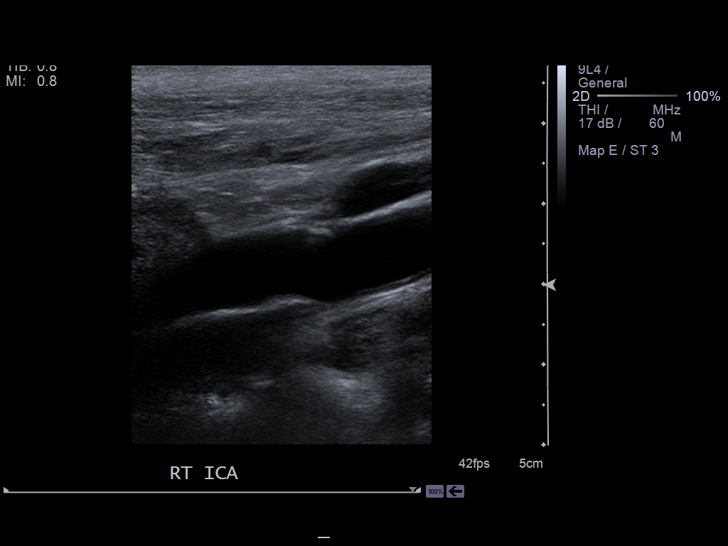
[im 30/63]
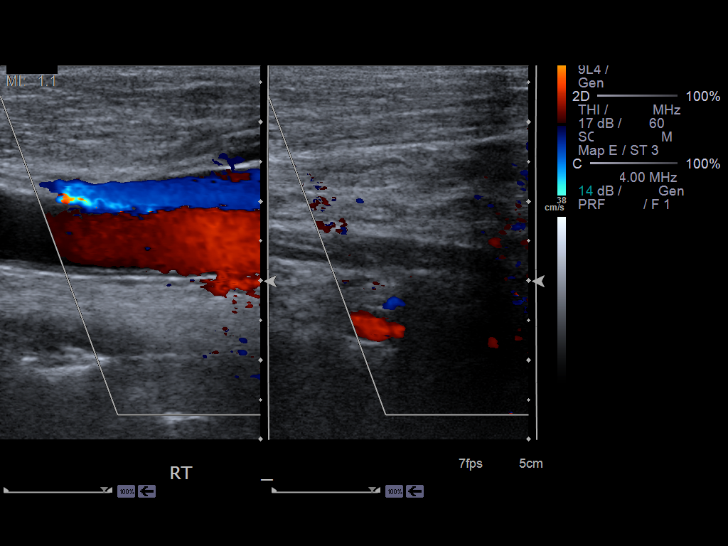
[im 33/63]
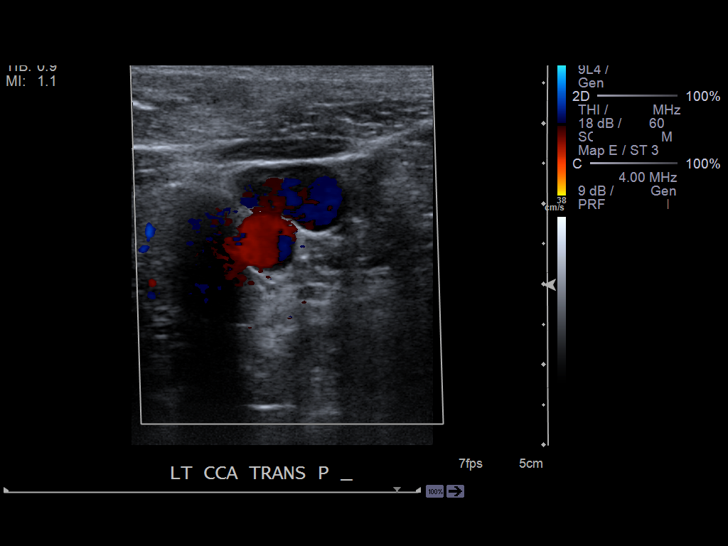
[im 38/63]
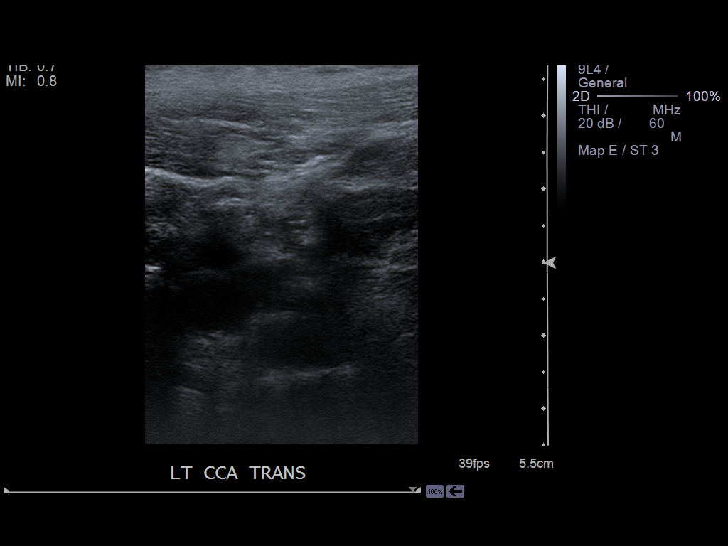
[im 44/63]
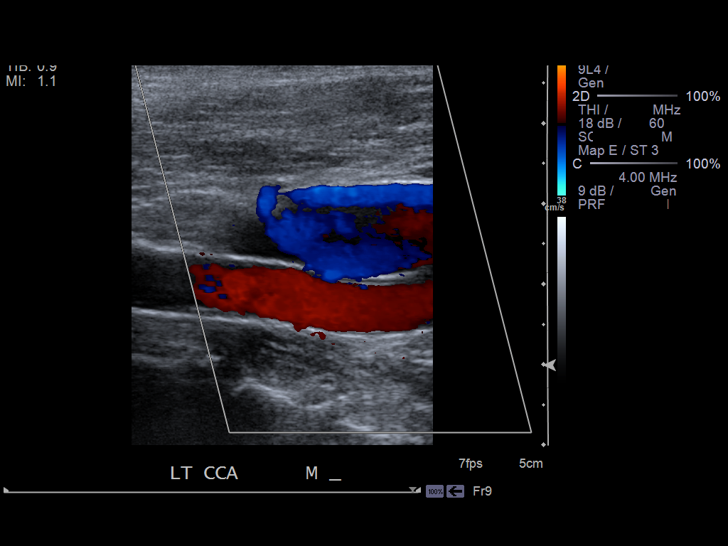
[im 49/63]
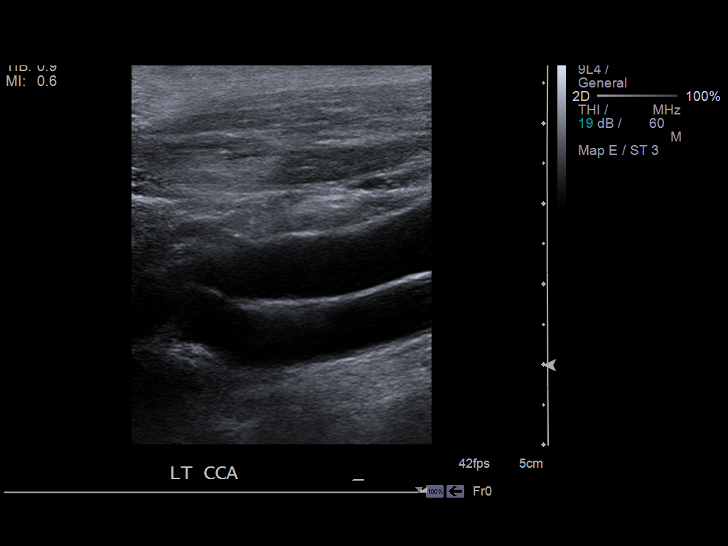
[im 52/63]
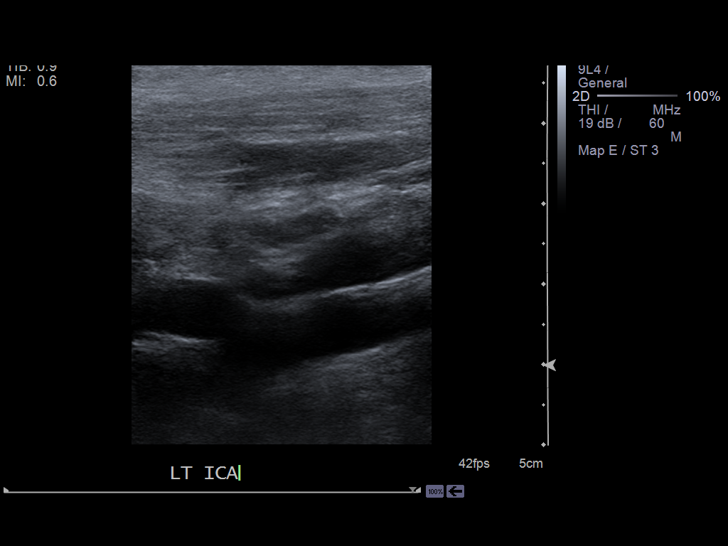
[im 57/63]
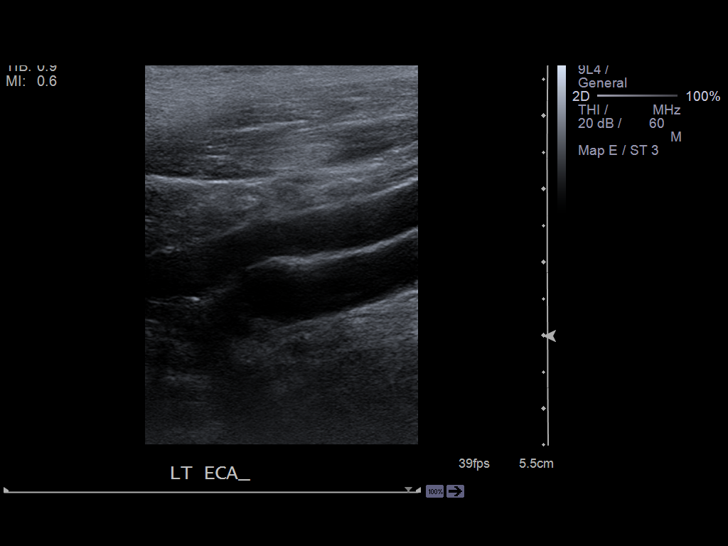
[im 63/63]
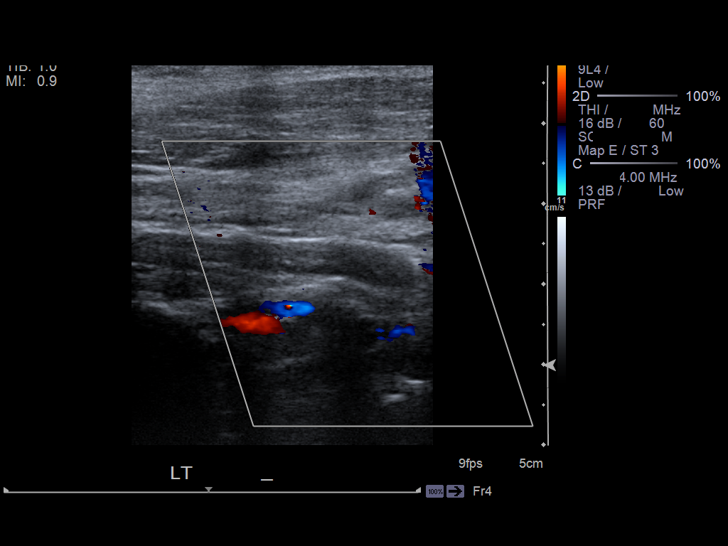

[14 of 24 positions shown; findings below may reference images not displayed]

PROCEDURE:     US  - US CAROTID DOPPLER BILATERAL  - March 01, 2012 [DATE]

RESULT:     Carotid Doppler interrogation is performed. Visually there is no
significant atherosclerotic disease in the left carotid system. On the right
there is minimal noncalcified and calcified plaque present without visual
evidence of significant stenosis. The color and spectral Doppler appearance
is normal. Antegrade flow is noted in the vertebral arteries. There is no
vertebral flow reversal. The peak systolic velocities are normal
bilaterally. The internal to common carotid peak systolic velocity ratio is
0.99 on the right and 0.69 on the left.
IMPRESSION: 1. No evidence of hemodynamically significant stenosis. Minimal
atherosclerotic disease.

[REDACTED]

## 2015-01-15 ENCOUNTER — Ambulatory Visit: Payer: Self-pay | Admitting: Internal Medicine

## 2015-04-16 ENCOUNTER — Other Ambulatory Visit: Payer: Self-pay

## 2015-04-23 ENCOUNTER — Ambulatory Visit: Payer: Self-pay | Admitting: Internal Medicine

## 2015-11-04 ENCOUNTER — Emergency Department
Admission: EM | Admit: 2015-11-04 | Discharge: 2015-11-04 | Disposition: A | Discharge status: Home / Self Care | Payer: Medicare Other | Attending: Emergency Medicine | Admitting: Emergency Medicine

## 2015-11-04 DIAGNOSIS — Z79899 Other long term (current) drug therapy: Secondary | ICD-10-CM | POA: Insufficient documentation

## 2015-11-04 DIAGNOSIS — L02414 Cutaneous abscess of left upper limb: Secondary | ICD-10-CM | POA: Insufficient documentation

## 2015-11-04 DIAGNOSIS — L0291 Cutaneous abscess, unspecified: Secondary | ICD-10-CM

## 2015-11-04 MED ORDER — HYDROCODONE-ACETAMINOPHEN 5-325 MG PO TABS: 1.0000 | ORAL_TABLET | ORAL | 0 refills | Status: AC | PRN

## 2015-11-04 MED ORDER — CLINDAMYCIN HCL 150 MG PO CAPS
ORAL_CAPSULE | ORAL | 0 refills | Status: AC
Start: 2015-11-04 — End: ?

## 2015-11-04 NOTE — ED Provider Notes (Signed)
[facility] Emergency Department Provider Note   ____________________________________________  Time seen: Approximately 7:32 AM  I have reviewed the triage vital signs and the nursing notes.   HISTORY  Chief Complaint Abscess   HPI Jordan [name] is a 65 y.o. male left shoulder abscess for several days. Patient states that it opened up during the night and started draining on its own. Patient states is still painful and he is familiar with abscesses before as he's had one other. He denies any fever or chills. He denies any nausea or vomiting.Currently he rates his pain as 0/10.   No past medical history on file.  There are no active problems to display for this patient.   No past surgical history on file.  Current Outpatient Rx  . Order #: [id number] Class: Historical Med  . Order #: [id number] Class: Historical Med  . Order #: [id number] Class: Print  . Order #: [id number] Class: Print    Allergies Sulfa antibiotics  No family history on file.  Social History Social History  Substance Use Topics  . Smoking status: Not on file  . Smokeless tobacco: Not on file  . Alcohol use Not on file    Review of Systems Constitutional: No fever/chills Cardiovascular: Denies chest pain. Respiratory: Denies shortness of breath. Gastrointestinal: No nausea, no vomiting.  Skin: Positive for abscess Neurological: Negative for headaches  10-point ROS otherwise negative.  ____________________________________________   PHYSICAL EXAM:  VITAL SIGNS: ED Triage Vitals [11/04/15 0706]  Enc Vitals Group     BP (!) 152/79     Pulse Rate 74     Resp 16     Temp 98.4 F (36.9 C)     Temp Source Oral     SpO2 98 %     Weight 168 lb (76.2 kg)     Height 5\' 3"  (1.6 m)     Head Circumference      Peak Flow      Pain Score 0     Pain Loc      Pain Edu?      Excl. in GC?     Constitutional: Alert and oriented. Well appearing and in no acute  distress. Eyes: Conjunctivae are normal. PERRL. EOMI. Head: Atraumatic. Nose: No congestion/rhinnorhea. Neck: No stridor.   Hematological/Lymphatic/Immunilogical: No cervical lymphadenopathy. Cardiovascular: Normal rate, regular rhythm. Grossly normal heart sounds.  Good peripheral circulation. Respiratory: Normal respiratory effort.  No retractions. Lungs CTAB. Gastrointestinal: Soft and nontender. No distention.  Musculoskeletal: Moves upper and lower extremities without any difficulty and normal gait was noted. Neurologic:  Normal speech and language. No gross focal neurologic deficits are appreciated. No gait instability. Skin:  Skin is warm, dry. There is a 2 cm open lesion posterior left shoulder with active drainage. Area is tender to palpation. No extending cellulitis surrounding this area. Psychiatric: Mood and affect are normal. Speech and behavior are normal.  ____________________________________________   LABS (all labs ordered are listed, but only abnormal results are displayed)  Labs Reviewed - No data to display  PROCEDURES  Procedure(s) performed: None  Procedures  Critical Care performed: No  ____________________________________________   INITIAL IMPRESSION / ASSESSMENT AND PLAN / ED COURSE  Pertinent labs & imaging results that were available during my care of the patient were reviewed by me and considered in my medical decision making (see chart for details).    Clinical Course   Patient was placed on clindamycin 300 mg every 6 hours for infection for the next 7  days. Patient was also given a prescription for Norco as needed for pain. He is to use warm compresses to the area frequently and follow-up with [facility] clinic or surgeon of his choice.   ____________________________________________   FINAL CLINICAL IMPRESSION(S) / ED DIAGNOSES  Final diagnoses:  Abscess  left posterior shoulder    NEW MEDICATIONS STARTED DURING THIS VISIT:  New  Prescriptions   CLINDAMYCIN (CLEOCIN) 150 MG CAPSULE    Take 2 capsules every 6 hours for 7 days.   HYDROCODONE-ACETAMINOPHEN (NORCO/VICODIN) 5-325 MG TABLET    Take 1 tablet by mouth every 4 (four) hours as needed for moderate pain.     Note:  This document was prepared using Dragon voice recognition software and may include unintentional dictation errors.    Tommi Rumps, PA-C 11/04/15 0800    Nita Sickle, MD 11/04/15 [phone]

## 2015-11-04 NOTE — Discharge Instructions (Signed)
Apply warm moist compresses to your shoulder frequently today. Begin taking antibiotics as directed. Norco as needed for severe pain. Follow-up with [facility] clinic if any continued problems.

## 2015-11-04 NOTE — ED Notes (Signed)
See triage note  Has a large raised area to post left shoulder   Area is draining bloody fluids

## 2015-11-04 NOTE — ED Triage Notes (Signed)
Pt reports blackhead on his back that became infected, reports it "busted" last night and blood and pus came out.

## 2016-02-04 ENCOUNTER — Telehealth: Payer: Self-pay

## 2016-02-04 NOTE — Telephone Encounter (Signed)
Medication mgmt called in with pt wanting Rx filled. Pt has not been seen in over a year. Pt is also 65 now so he should be receiving medicare. Pt will need to prove he does not have insurance and make an appt before he can his refills. Pt's phone was not accepting calls at this time. Will try again later.

## 2019-03-26 ENCOUNTER — Other Ambulatory Visit: Payer: Self-pay | Admitting: Family Medicine

## 2019-03-26 DIAGNOSIS — B182 Chronic viral hepatitis C: Secondary | ICD-10-CM

## 2019-04-09 ENCOUNTER — Ambulatory Visit: Admission: RE | Admit: 2019-04-09 | Payer: Medicare Other | Source: Ambulatory Visit

## 2019-04-16 ENCOUNTER — Ambulatory Visit: Payer: Medicare Other

## 2019-04-27 ENCOUNTER — Other Ambulatory Visit: Payer: Self-pay

## 2019-04-27 ENCOUNTER — Ambulatory Visit
Admission: RE | Admit: 2019-04-27 | Discharge: 2019-04-27 | Disposition: A | Discharge status: Home / Self Care | Payer: Medicare Other | Source: Ambulatory Visit | Attending: Family Medicine | Admitting: Family Medicine

## 2019-04-27 DIAGNOSIS — B182 Chronic viral hepatitis C: Secondary | ICD-10-CM | POA: Diagnosis not present

## 2021-12-08 IMAGING — US US ABDOMEN LIMITED
1 series · 14 of 25 positions shown · non-contrast
Comparison: Ultrasound report 5999.  CT report 02/28/1998.

CLINICAL DATA: Hepatitis-C.  HCC screening.

EXAM:
ULTRASOUND ABDOMEN LIMITED RIGHT UPPER QUADRANT

[Series 1: us abdomen limited · 0.25mm/px · 14 of 39 slices shown]
[im 1/39]
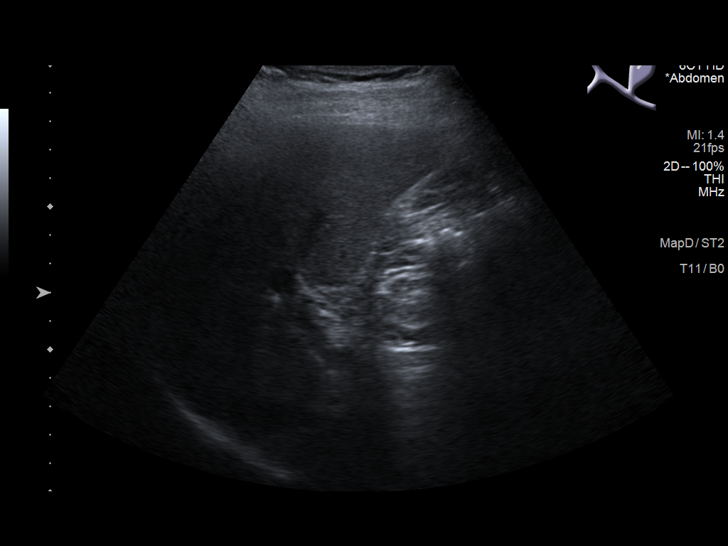
[im 4/39]
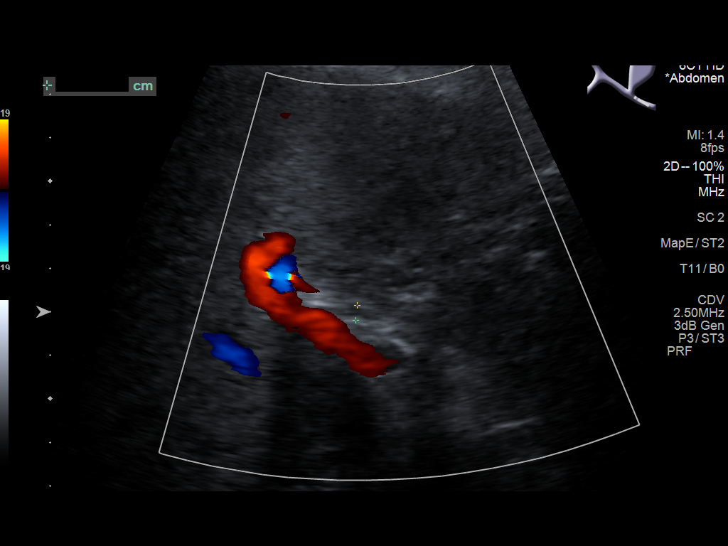
[im 7/39]
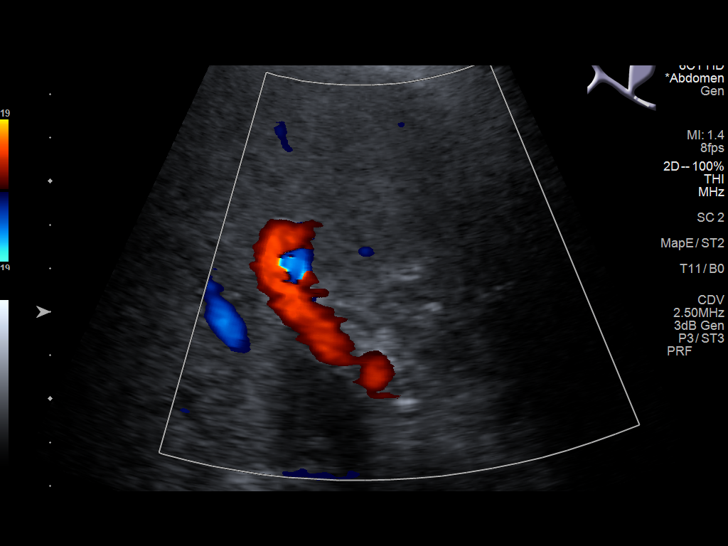
[im 10/39]
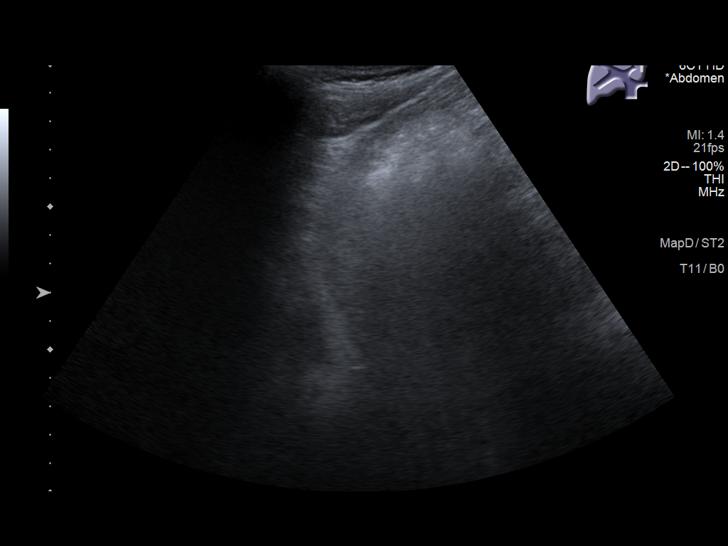
[im 13/39]
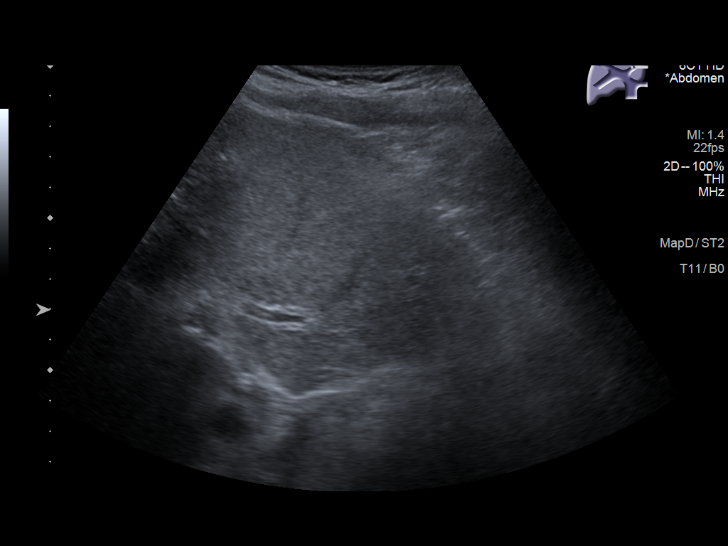
[im 15/39]
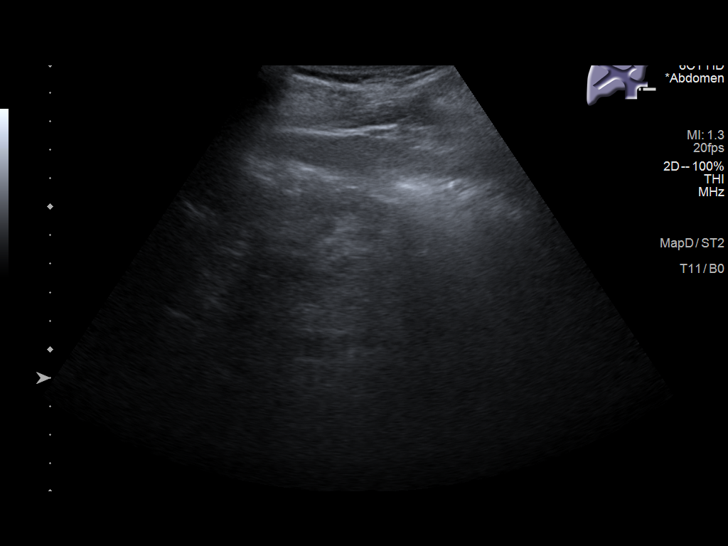
[im 18/39]
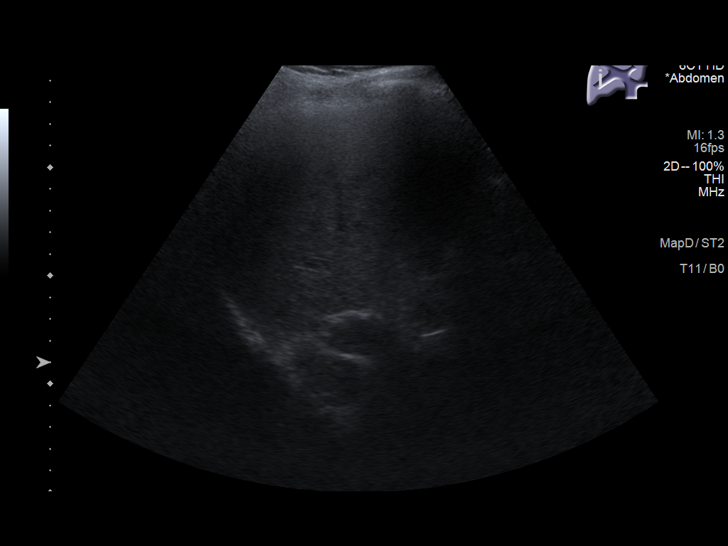
[im 21/39]
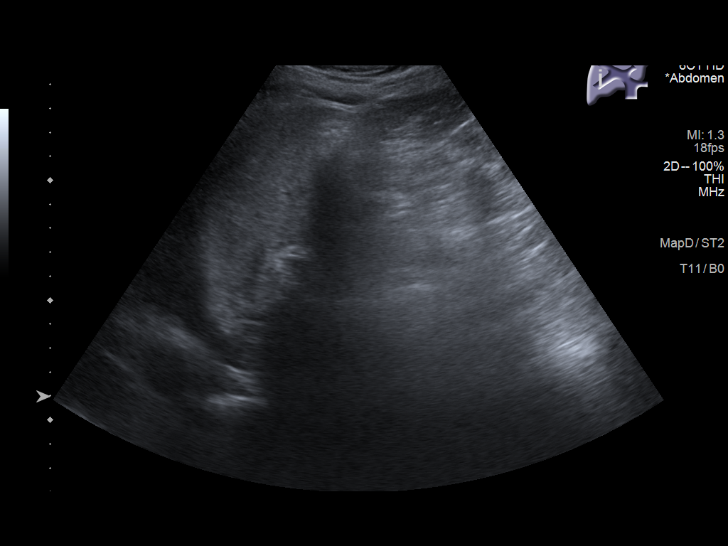
[im 24/39]
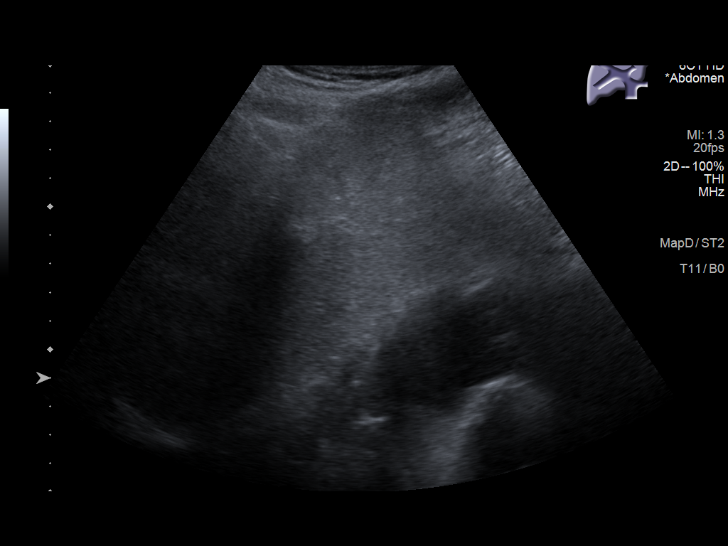
[im 26/39]
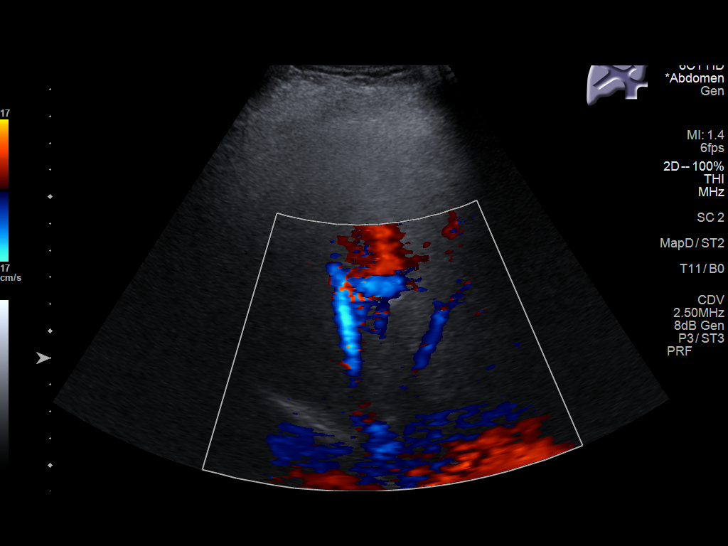
[im 29/39]
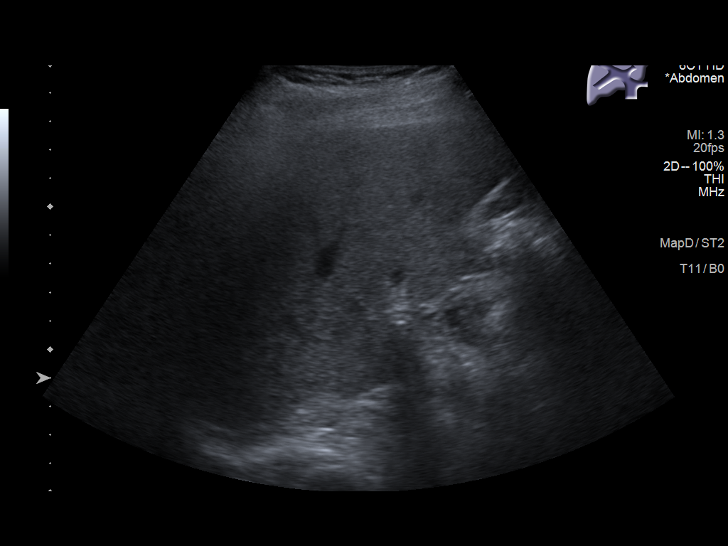
[im 32/39]
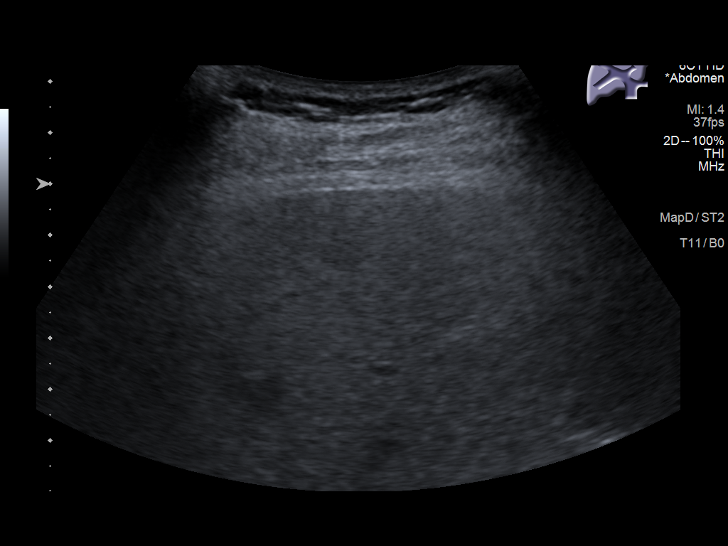
[im 35/39]
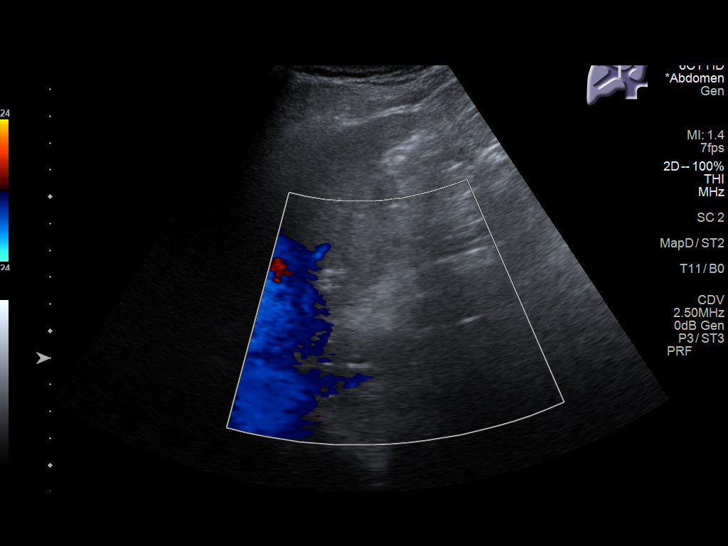
[im 39/39]
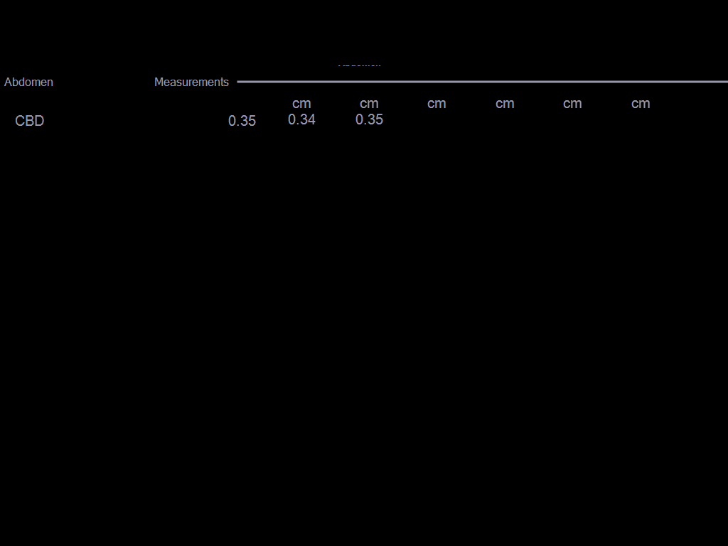

[14 of 25 positions shown; findings below may reference images not displayed]

FINDINGS: Gallbladder:

Cholecystectomy.

Common bile duct:

Diameter: 3.5 mm

Liver:

Increased hepatic echogenicity consistent with fatty infiltration
and/or hepatocellular disease. Liver increased echogenicity makes
evaluation for focal liver lesions difficult. No focal hepatic
abnormality identified. Portal vein is patent on color Doppler
imaging with normal direction of blood flow towards the liver.

Other: None.
IMPRESSION: 1.  Prior cholecystectomy.  No biliary distention.

2. Liver is echogenic consistent fatty infiltration and/or
hepatocellular disease. Liver increase echogenicity makes evaluation
for focal lesions difficult. No focal hepatic abnormality
identified.

## 2023-10-18 ENCOUNTER — Other Ambulatory Visit: Payer: Self-pay | Admitting: Neurology

## 2023-10-18 DIAGNOSIS — E1169 Type 2 diabetes mellitus with other specified complication: Secondary | ICD-10-CM

## 2023-10-18 DIAGNOSIS — R200 Anesthesia of skin: Secondary | ICD-10-CM
